# Patient Record
Sex: Male | Born: 2000 | Race: Black or African American | Hispanic: No | Marital: Single | Smoking: Never smoker
Health system: Southern US, Community
[De-identification: ages and names within clinical notes are randomized; demographics above are authoritative.]

## PROBLEM LIST (undated history)

## (undated) DIAGNOSIS — T7840XA Allergy, unspecified, initial encounter: Secondary | ICD-10-CM

## (undated) DIAGNOSIS — R040 Epistaxis: Secondary | ICD-10-CM

## (undated) HISTORY — PX: HAND SURGERY: SHX662

---
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
## 2024-06-12 ENCOUNTER — Emergency Department
Admission: EM | Admit: 2024-06-12 | Discharge: 2024-06-12 | Disposition: A | Discharge status: Home / Self Care | Attending: Emergency Medicine | Admitting: Emergency Medicine

## 2024-06-12 ENCOUNTER — Emergency Department

## 2024-06-12 ENCOUNTER — Encounter: Payer: Self-pay | Admitting: *Deleted

## 2024-06-12 DIAGNOSIS — R112 Nausea with vomiting, unspecified: Secondary | ICD-10-CM | POA: Diagnosis not present

## 2024-06-12 DIAGNOSIS — R109 Unspecified abdominal pain: Secondary | ICD-10-CM | POA: Insufficient documentation

## 2024-06-12 DIAGNOSIS — R519 Headache, unspecified: Secondary | ICD-10-CM | POA: Insufficient documentation

## 2024-06-12 DIAGNOSIS — R059 Cough, unspecified: Secondary | ICD-10-CM | POA: Insufficient documentation

## 2024-06-12 LAB — CBC
HCT: 44 % (ref 39.0–52.0)
Hemoglobin: 14.2 g/dL (ref 13.0–17.0)
MCH: 26.1 pg (ref 26.0–34.0)
MCHC: 32.3 g/dL (ref 30.0–36.0)
MCV: 80.7 fL (ref 80.0–100.0)
Platelets: 260 K/uL (ref 150–400)
RBC: 5.45 MIL/uL (ref 4.22–5.81)
RDW: 13.2 % (ref 11.5–15.5)
WBC: 10 K/uL (ref 4.0–10.5)
nRBC: 0 % (ref 0.0–0.2)

## 2024-06-12 LAB — COMPREHENSIVE METABOLIC PANEL WITH GFR
ALT: 20 U/L (ref 0–44)
AST: 24 U/L (ref 15–41)
Albumin: 4.8 g/dL (ref 3.5–5.0)
Alkaline Phosphatase: 70 U/L (ref 38–126)
Anion gap: 18 — ABNORMAL HIGH (ref 5–15)
BUN: 16 mg/dL (ref 6–20)
CO2: 19 mmol/L — ABNORMAL LOW (ref 22–32)
Calcium: 10.1 mg/dL (ref 8.9–10.3)
Chloride: 100 mmol/L (ref 98–111)
Creatinine, Ser: 1.08 mg/dL (ref 0.61–1.24)
GFR, Estimated: >60 mL/min
Glucose, Bld: 116 mg/dL — ABNORMAL HIGH (ref 70–99)
Potassium: 3.8 mmol/L (ref 3.5–5.1)
Sodium: 137 mmol/L (ref 135–145)
Total Bilirubin: 0.6 mg/dL (ref 0.0–1.2)
Total Protein: 7.7 g/dL (ref 6.5–8.1)

## 2024-06-12 LAB — RESP PANEL BY RT-PCR (RSV, FLU A&B, COVID)  RVPGX2
Influenza A by PCR: NEGATIVE
Influenza B by PCR: NEGATIVE
Resp Syncytial Virus by PCR: NEGATIVE
SARS Coronavirus 2 by RT PCR: NEGATIVE

## 2024-06-12 LAB — LIPASE, BLOOD: Lipase: 22 U/L (ref 11–51)

## 2024-06-12 MED ORDER — ONDANSETRON 4 MG PO TBDP: 4.0000 mg | ORAL_TABLET | Freq: Three times a day (TID) | ORAL | 0 refills | Status: AC | PRN

## 2024-06-12 MED ORDER — PROCHLORPERAZINE EDISYLATE 10 MG/2ML IJ SOLN
10.0000 mg | Freq: Once | INTRAMUSCULAR | Status: AC
  Administered 2024-06-12: 10 mg via INTRAVENOUS
  Filled 2024-06-12: qty 2

## 2024-06-12 MED ORDER — LACTATED RINGERS IV BOLUS
1000.0000 mL | Freq: Once | INTRAVENOUS | Status: AC
  Administered 2024-06-12: 1000 mL via INTRAVENOUS

## 2024-06-12 MED ORDER — ONDANSETRON 4 MG PO TBDP
4.0000 mg | ORAL_TABLET | Freq: Once | ORAL | Status: AC
  Administered 2024-06-12: 4 mg via ORAL
  Filled 2024-06-12: qty 1

## 2024-06-12 MED ORDER — KETOROLAC TROMETHAMINE 30 MG/ML IJ SOLN
15.0000 mg | Freq: Once | INTRAMUSCULAR | Status: AC
  Administered 2024-06-12: 15 mg via INTRAVENOUS
  Filled 2024-06-12: qty 1

## 2024-06-12 MED ORDER — DIPHENHYDRAMINE HCL 50 MG/ML IJ SOLN
25.0000 mg | Freq: Once | INTRAMUSCULAR | Status: AC
  Administered 2024-06-12: 25 mg via INTRAVENOUS
  Filled 2024-06-12: qty 1

## 2024-06-12 NOTE — ED Provider Triage Note (Signed)
 Emergency Medicine Provider Triage Evaluation Note  Vincent Hurley , a 24 y.o. male  was evaluated in triage.  Pt complains of with no significant past medical history presents emergency department with abdominal pain, vomiting, cough, fever chills.  Cough started 2 days ago.  Fever chills abdominal pain and nausea started today while he was getting a tattoo.  Did not eat prior to the tattoo.  Never had trouble when he has gotten a previous tattoo..  Review of Systems  Positive:  Negative:   Physical Exam  BP 126/80 (BP Location: Left Arm)   Pulse 97   Temp 99.2 F (37.3 C) (Oral)   Resp (!) 22   Ht 6' 1 (1.854 m)   Wt 77 kg   SpO2 96%   BMI 22.40 kg/m  Gen:   Awake, no distress   Resp:  Normal effort  MSK:   Moves extremities without difficulty  Other:  Abdomen nontender  Medical Decision Making  Medically screening exam initiated at 4:35 PM.  Appropriate orders placed.  Vincent Hurley was informed that the remainder of the evaluation will be completed by another provider, this initial triage assessment does not replace that evaluation, and the importance of remaining in the ED until their evaluation is complete.     Gasper Devere ORN, PA-C 06/12/24 1636

## 2024-06-12 NOTE — ED Notes (Signed)
 Pt states he feels much better - IV fluids reconnected at this time to complete bag.

## 2024-06-12 NOTE — ED Notes (Signed)
Patient verbalizes understanding of discharge instructions. Opportunity for questioning and answers were provided. Armband removed by staff, pt discharged from ED. Wheeled out to lobby with mother  

## 2024-06-12 NOTE — ED Provider Notes (Signed)
 "  [facility] Provider Note    Event Date/Time   First MD Initiated Contact with Patient 06/12/24 1704     (approximate)   History   Chief Complaint Abdominal Pain   HPI  Vincent Hurley is a 24 y.o. male with no significant past medical history who presents to the ED complaining of abdominal pain.  Patient reports that he was getting a tattoo on his hand earlier today when he suddenly began to have a headache with abdominal discomfort, nausea, and vomiting.  He denies any associated diarrhea, does describe chills but has not had any fever.  He does admit that he has been feeling mildly ill over the past 2 days with cough and runny nose prior to developing the headache and nausea acutely today.  He denies any vision changes, speech changes, numbness, or weakness in his extremities.     Physical Exam   Triage Vital Signs: ED Triage Vitals  Encounter Vitals Group     BP 06/12/24 1631 126/80     Girls Systolic BP Percentile --      Girls Diastolic BP Percentile --      Boys Systolic BP Percentile --      Boys Diastolic BP Percentile --      Pulse Rate 06/12/24 1631 97     Resp 06/12/24 1631 (!) 22     Temp 06/12/24 1631 99.2 F (37.3 C)     Temp Source 06/12/24 1631 Oral     SpO2 06/12/24 1631 96 %     Weight 06/12/24 1632 169 lb 12.1 oz (77 kg)     Height 06/12/24 1632 6' 1 (1.854 m)     Head Circumference --      Peak Flow --      Pain Score 06/12/24 1632 10     Pain Loc --      Pain Education --      Exclude from Growth Chart --     Most recent vital signs: Vitals:   06/12/24 1631  BP: 126/80  Pulse: 97  Resp: (!) 22  Temp: 99.2 F (37.3 C)  SpO2: 96%    Constitutional: Alert and oriented. Eyes: Conjunctivae are normal. Head: Atraumatic. Nose: No congestion/rhinnorhea. Mouth/Throat: Mucous membranes are moist.  Neck: Supple with no meningismus. Cardiovascular: Normal rate, regular rhythm. Grossly normal heart sounds.  2+ radial  pulses bilaterally. Respiratory: Normal respiratory effort.  No retractions. Lungs CTAB. Gastrointestinal: Soft and nontender. No distention. Musculoskeletal: No lower extremity tenderness nor edema.  Neurologic:  Normal speech and language. No gross focal neurologic deficits are appreciated.    ED Results / Procedures / Treatments   Labs (all labs ordered are listed, but only abnormal results are displayed) Labs Reviewed  COMPREHENSIVE METABOLIC PANEL WITH GFR - Abnormal; Notable for the following components:      Result Value   CO2 19 (*)    Glucose, Bld 116 (*)    Anion gap 18 (*)    All other components within normal limits  RESP PANEL BY RT-PCR (RSV, FLU A&B, COVID)  RVPGX2  LIPASE, BLOOD  CBC  URINALYSIS, ROUTINE W REFLEX MICROSCOPIC    RADIOLOGY CT head reviewed and interpreted by me with no hemorrhage or midline shift.  PROCEDURES:  Critical Care performed: No  Procedures   MEDICATIONS ORDERED IN ED: Medications  ondansetron  (ZOFRAN -ODT) disintegrating tablet 4 mg (4 mg Oral Given 06/12/24 1639)  prochlorperazine  (COMPAZINE ) injection 10 mg (10 mg Intravenous Given 06/12/24  1815)  diphenhydrAMINE  (BENADRYL ) injection 25 mg (25 mg Intravenous Given 06/12/24 1814)  lactated ringers  bolus 1,000 mL (0 mLs Intravenous Stopped 06/12/24 2102)  ketorolac  (TORADOL ) 30 MG/ML injection 15 mg (15 mg Intravenous Given 06/12/24 2102)     IMPRESSION / MDM / ASSESSMENT AND PLAN / ED COURSE  I reviewed the triage vital signs and the nursing notes.                              24 y.o. male with no significant past medical history who presents to the ED complaining of acute onset headache, nausea, vomiting, and abdominal discomfort today while getting a tattoo on his hand.  Patient's presentation is most consistent with acute presentation with potential threat to life or bodily function.  Differential diagnosis includes, but is not limited to, SAH, meningitis, gastroenteritis,  pancreatitis, hepatitis, cholecystitis, biliary colic, dehydration, electrolyte abnormality, AKI, UTI, kidney stone, viral syndrome.  Patient nontoxic-appearing and in no acute distress, vital signs are unremarkable.  Patient's abdomen is soft without any focal tenderness and he has a nonfocal neurologic exam.  Symptoms seem most likely to be due to viral illness but given acute onset headache with nausea and vomiting, will check CT head.  Labs without significant anemia, leukocytosis, electrolyte abnormality, or AKI.  LFTs and lipase are unremarkable.  Patient with minimal improvement following dose of Zofran , will treat with IV Compazine  and Benadryl , hydrate with IV fluids.  CT head is negative for acute finding, doubt SAH given imaging within 6 hours of onset of symptoms.  Patient's nausea has improved following Compazine  and Benadryl , he is tolerating oral intake without difficulty but still reports some headache.  He was given IV Toradol  with improvement, has been unable to provide a urine sample but no symptoms to suggest UTI.  He is appropriate for discharge home, was counseled to follow-up with PCP and otherwise return to the ED for new or worsening symptoms.  Patient agrees with plan.      FINAL CLINICAL IMPRESSION(S) / ED DIAGNOSES   Final diagnoses:  Acute nonintractable headache, unspecified headache type  Nausea and vomiting, unspecified vomiting type     Rx / DC Orders   ED Discharge Orders          Ordered    ondansetron  (ZOFRAN -ODT) 4 MG disintegrating tablet  Every 8 hours PRN        06/12/24 2148             Note:  This document was prepared using Dragon voice recognition software and may include unintentional dictation errors.   Willo Dunnings, MD 06/12/24 2150  "

## 2024-06-12 NOTE — ED Triage Notes (Signed)
 Pt to triage via wheelchair.  Pt got a new tattoo today on left hand.  Afterwards, pt got abd pain, lightheaded, chills and fever.  Pt alert.
